# Patient Record
Sex: Male | Born: 1965 | Race: Black or African American | Hispanic: No | Marital: Married | Smoking: Current every day smoker
Health system: Southern US, Community
[De-identification: ages and names within clinical notes are randomized; demographics above are authoritative.]

## PROBLEM LIST (undated history)

## (undated) DIAGNOSIS — I10 Essential (primary) hypertension: Secondary | ICD-10-CM

## (undated) HISTORY — PX: WRIST SURGERY: SHX841

## (undated) HISTORY — DX: Essential (primary) hypertension: I10

---

## 2003-02-07 ENCOUNTER — Emergency Department (HOSPITAL_COMMUNITY): Admission: EM | Admit: 2003-02-07 | Discharge: 2003-02-08 | Payer: Self-pay | Admitting: Emergency Medicine

## 2010-10-26 ENCOUNTER — Emergency Department (INDEPENDENT_AMBULATORY_CARE_PROVIDER_SITE_OTHER): Payer: Worker's Compensation

## 2010-10-26 ENCOUNTER — Emergency Department (HOSPITAL_BASED_OUTPATIENT_CLINIC_OR_DEPARTMENT_OTHER)
Admission: EM | Admit: 2010-10-26 | Discharge: 2010-10-26 | Disposition: A | Discharge status: Other Acute Inpatient Hospital | Payer: Worker's Compensation | Source: Home / Self Care | Attending: Emergency Medicine | Admitting: Emergency Medicine

## 2010-10-26 ENCOUNTER — Ambulatory Visit (HOSPITAL_BASED_OUTPATIENT_CLINIC_OR_DEPARTMENT_OTHER)
Admission: RE | Admit: 2010-10-26 | Discharge: 2010-10-26 | Disposition: A | Discharge status: Home / Self Care | Payer: Worker's Compensation | Source: Ambulatory Visit | Attending: Orthopedic Surgery | Admitting: Orthopedic Surgery

## 2010-10-26 DIAGNOSIS — Y9289 Other specified places as the place of occurrence of the external cause: Secondary | ICD-10-CM | POA: Insufficient documentation

## 2010-10-26 DIAGNOSIS — Y93G3 Activity, cooking and baking: Secondary | ICD-10-CM | POA: Insufficient documentation

## 2010-10-26 DIAGNOSIS — Y990 Civilian activity done for income or pay: Secondary | ICD-10-CM | POA: Insufficient documentation

## 2010-10-26 DIAGNOSIS — W269XXA Contact with unspecified sharp object(s), initial encounter: Secondary | ICD-10-CM

## 2010-10-26 DIAGNOSIS — Z01812 Encounter for preprocedural laboratory examination: Secondary | ICD-10-CM | POA: Insufficient documentation

## 2010-10-26 DIAGNOSIS — W3189XA Contact with other specified machinery, initial encounter: Secondary | ICD-10-CM | POA: Insufficient documentation

## 2010-10-26 DIAGNOSIS — S61409A Unspecified open wound of unspecified hand, initial encounter: Secondary | ICD-10-CM | POA: Insufficient documentation

## 2010-10-26 DIAGNOSIS — S5420XA Injury of radial nerve at forearm level, unspecified arm, initial encounter: Secondary | ICD-10-CM | POA: Insufficient documentation

## 2010-10-26 DIAGNOSIS — R209 Unspecified disturbances of skin sensation: Secondary | ICD-10-CM | POA: Insufficient documentation

## 2010-10-26 DIAGNOSIS — W268XXA Contact with other sharp object(s), not elsewhere classified, initial encounter: Secondary | ICD-10-CM | POA: Insufficient documentation

## 2010-10-26 DIAGNOSIS — M7989 Other specified soft tissue disorders: Secondary | ICD-10-CM

## 2010-10-26 DIAGNOSIS — S66909A Unspecified injury of unspecified muscle, fascia and tendon at wrist and hand level, unspecified hand, initial encounter: Secondary | ICD-10-CM | POA: Insufficient documentation

## 2010-10-26 DIAGNOSIS — S41119A Laceration without foreign body of unspecified upper arm, initial encounter: Secondary | ICD-10-CM

## 2010-10-26 DIAGNOSIS — S61509A Unspecified open wound of unspecified wrist, initial encounter: Secondary | ICD-10-CM

## 2010-10-26 DIAGNOSIS — IMO0002 Reserved for concepts with insufficient information to code with codable children: Secondary | ICD-10-CM

## 2010-10-26 LAB — BASIC METABOLIC PANEL
CO2: 27 mEq/L (ref 19–32)
Calcium: 9.7 mg/dL (ref 8.4–10.5)
Chloride: 107 mEq/L (ref 96–112)
Creatinine, Ser: 0.9 mg/dL (ref 0.50–1.35)
Glucose, Bld: 98 mg/dL (ref 70–99)
Sodium: 144 mEq/L (ref 135–145)

## 2010-10-26 LAB — DIFFERENTIAL
Eosinophils Relative: 0 % (ref 0–5)
Lymphocytes Relative: 36 % (ref 12–46)
Lymphs Abs: 1.8 10*3/uL (ref 0.7–4.0)
Monocytes Absolute: 0.4 10*3/uL (ref 0.1–1.0)
Neutro Abs: 2.7 10*3/uL (ref 1.7–7.7)

## 2010-10-26 LAB — CBC
HCT: 40.7 % (ref 39.0–52.0)
MCV: 89.8 fL (ref 78.0–100.0)
RBC: 4.53 MIL/uL (ref 4.22–5.81)
WBC: 4.9 10*3/uL (ref 4.0–10.5)

## 2010-10-26 MED ORDER — FENTANYL CITRATE 0.05 MG/ML IJ SOLN
50.0000 ug | Freq: Once | INTRAMUSCULAR | Status: AC
  Administered 2010-10-26: 50 ug via INTRAVENOUS
  Administered 2010-10-26: 100 ug via INTRAVENOUS

## 2010-10-26 MED ORDER — CEFAZOLIN SODIUM 1-5 GM-% IV SOLN
1.0000 g | Freq: Once | INTRAVENOUS | Status: AC
  Administered 2010-10-26: 1 g via INTRAVENOUS

## 2010-10-26 MED ORDER — SODIUM CHLORIDE 0.9 % IV BOLUS (SEPSIS)
500.0000 mL | Freq: Once | INTRAVENOUS | Status: AC
  Administered 2010-10-26: 500 mL via INTRAVENOUS

## 2010-10-26 MED ORDER — FENTANYL CITRATE 0.05 MG/ML IJ SOLN
INTRAMUSCULAR | Status: AC
  Administered 2010-10-26: 100 ug via INTRAVENOUS
  Filled 2010-10-26: qty 2

## 2010-10-26 MED ORDER — CEFAZOLIN SODIUM 1-5 GM-% IV SOLN
INTRAVENOUS | Status: AC
  Administered 2010-10-26: 09:00:00
  Filled 2010-10-26: qty 50

## 2010-10-26 NOTE — ED Notes (Signed)
MD at bedside for evaluation

## 2010-10-26 NOTE — ED Notes (Signed)
Pt transported to radiology for XR and returned to ED room.

## 2010-10-26 NOTE — ED Notes (Signed)
Laceration to dorsal aspect of left hand.  Affected area is actively bleeding.  Pressure dressing reapplied to affected area.

## 2010-10-26 NOTE — ED Notes (Signed)
Pt is unable to close hand, sensation is present in all digits and decreased cap refill 3+ per MD. Pain 10/10 with movement of hand  Pt returned from radiology and dressing reapplied.

## 2010-10-26 NOTE — ED Notes (Signed)
Correction to previous medication entry-  Charted a total of 150 mcg of Fentanyl administered and actual 50 mcg administered. Actual dose of Ancef administered was 1 gram.  Unable to correct in MAR at time of pt d/c.  Report called to Marcelino Duster, RN at [facility] short stay surgery center.  Phone number for return call for questions given.  IV d/c'd prior to transfer.

## 2010-10-26 NOTE — ED Provider Notes (Signed)
History     CSN: [id number] Arrival date & time: 10/26/2010  8:26 AM  Chief Complaint  Patient presents with  . Extremity Laceration   The history is provided by the patient. No language interpreter was used.  Laceration is approximately 2 inches in length just proximal to the left wrist. Left hand is his non-dominant hand. Tetanus is within the last year. It occurred within the last hour while at work.  A metal scoop came down and lacerated the dorsum of the left arm.  He states the arm is numb. He cannot move the wrist of the fingers of the left hand.  No f/c/r. No head trauma. No LOC.  No associated injuries  Patient is not on anticoagulants.  Pain at site is a 10/ 10.  Has not taken anything for pain.  Worse when people attempt to move the extremity  History reviewed. No pertinent past medical history.  History reviewed. No pertinent past surgical history.  History reviewed. No pertinent family history.  History  Substance Use Topics  . Smoking status: Never Smoker   . Smokeless tobacco: Not on file  . Alcohol Use: No      Review of Systems  Constitutional: Negative for activity change.  HENT: Negative for facial swelling.   Eyes: Negative for discharge.  Respiratory: Negative for apnea.   Cardiovascular: Negative for chest pain.  Gastrointestinal: Negative for abdominal distention.  Genitourinary: Negative for difficulty urinating.  Musculoskeletal: Negative for joint swelling and arthralgias.  Neurological: Negative for dizziness.  Hematological: Negative.   Psychiatric/Behavioral: Negative.     Physical Exam  BP 162/113  Pulse 84  Temp(Src) 98.7 F (37.1 C) (Oral)  Resp 21  SpO2 99%  Physical Exam  Constitutional: He is oriented to person, place, and time. He appears well-developed and well-nourished. No distress.  HENT:  Head: Normocephalic and atraumatic.  Mouth/Throat: No oropharyngeal exudate.  Eyes: EOM are normal. Pupils are equal, round, and reactive  to light.  Neck: Normal range of motion. Neck supple.  Cardiovascular: Normal rate and regular rhythm.  Exam reveals no gallop and no friction rub.   No murmur heard. Pulmonary/Chest: Effort normal and breath sounds normal.  Abdominal: Soft. Bowel sounds are normal.  Musculoskeletal:       Unable to flex the fingers, unable to extend the left wrist.  Sensation intact on exam.   Visible tendon lacerations and vascular laceration within the wound. Cap refill in the left hand 2 seconds.  Hand warm.    Neurological: He is alert and oriented to person, place, and time.  Skin: Skin is warm and dry.  Psychiatric: He has a normal mood and affect.    ED Course  Procedures  1 g ancef hung, IV bolus and 50 mcg fentanyl IV for pain MDM Case d/w nurse of Dr. Merlyn Lot who will call back.  Wants a full wrist series.  Pull IV and go immediately to same day surgery.  Wife will take patient.        Jasmine Awe, MD 10/26/10 [phone]

## 2010-10-26 NOTE — ED Notes (Signed)
MD at bedside.Plans for Dr. Merlyn Lot to meet at [facility]. Preparing patient for transfer now via POV. Will pull IV before discharge.

## 2010-10-26 NOTE — ED Notes (Signed)
UDS collected by Mauricio Po, EMT.

## 2010-10-26 NOTE — ED Notes (Signed)
Reassessment of left extremity.  No change in assessment.  Sensation and movement present, color WNL and cap refill WNL.  Bandage CDI.

## 2010-11-09 NOTE — Op Note (Signed)
Willie Terry, Willie Terry NO.:  [id number]  MEDICAL RECORD NO.:  [id number]  LOCATION:  [facility]01                          FACILITY:  [facility]  PHYSICIAN:  Betha Loa, MD        DATE OF BIRTH:  Nov 04, 1965  DATE OF PROCEDURE:  10/26/2010 DATE OF DISCHARGE:  10/26/2010                              OPERATIVE REPORT   PREOPERATIVE DIAGNOSIS:  Left wrist dorsal laceration with tendon laceration to the extensor digitorum communis and superficial branch radial nerve laceration.  POSTOPERATIVE DIAGNOSIS:  Left wrist dorsal laceration with laceration of extensor digitorum communis to index, extensor digitorum communis to long, extensor indicis proprius, extensor carpi radialis longus, complete extensor carpi radialis brevis laceration, partial laceration superficial branch radial nerve.  PROCEDURE:  Left wrist irrigation debridement, repair of the extensor digitorum communis to long and index, extensor indicis proprius, extensor carpi radialis longus, extensor carpi radialis brevis, and superficial branch of radial nerve.  SURGEON:  Betha Loa, MD  ASSISTANTS:  None.  ANESTHESIA:  General.  IV FLUIDS:  Per anesthesia flow sheet.  ESTIMATED BLOOD LOSS:  Minimal.  COMPLICATIONS:  None.  SPECIMENS:  None.  TOURNIQUET TIME:  86 minutes.  DISPOSITION:  Stable to PACU.  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  He was seen at [facility] where he was evaluated.  Radiographs were taken revealing no fractures.  He was found to have a laceration.  I was consulted for management of the injury.  He was transferred to [facility] for my evaluation.  On evaluation, he had intact sensation and capillary refill in all fingertips.  He had weakness with extension of the wrist and difficulty extending his fingers.  He  also had some numbness on the dorsum of the hand.  There was a laceration at the dorsal aspect of the wrist more on the radial side.  I discussed with Willie Terry the nature of his injury.  We discussed operative treatment with irrigation, debridement, and repair of tendon lacerations.  Risks, benefits and alternatives of surgery were discussed including the risk of blood loss, infection, damage to nerves, vessels, tendons, ligaments, bone, failure of procedure, need for additional procedures, complications with wound healing, continued pain, weakness and lack of strength.  He voiced understanding of these risks and elected to proceed.  OPERATIVE COURSE:  After being identified preoperatively by myself, the patient and I agreed upon the procedure and site of procedure.  The surgery site was marked.  The risks, benefits and alternatives of surgery were reviewed and he wished to proceed.  He was given 2 g of IV Ancef for antibiotic coverage for his wound.  His tetanus had been updated within the last 2 years.  He was transported to the operating room and placed on the operating room table in supine position with the left upper extremity on arm board.  General anesthesia was induced by the anesthesiologist.  Left upper extremity was prepped and draped in normal sterile orthopedic fashion.  A surgical pause was performed  between surgeons, Anesthesia, operating room staff and all were in agreement as to the patient, procedure, and site of procedure. Tourniquet at the proximal aspect of the extremity was inflated to 250 mmHg after exsanguination of the limb with an Esmarch bandage.  The wound was extended both proximally and distally.  It was extended distally on the radial side and proximally the ulnar side.  Spreading technique was used in the deeper tissues.  The superficial branch of the radial nerve was identified and had a partial laceration.  The ECRL was identified and had a partial  laceration approximately 50%.  The ECRB had a 100% laceration. The extensor digitorum communis tendons were identified.  There was laceration to the EDC, to the long, and index as well as the EIP to the index.  The EIP and EDC to the index had retracted.  The EPL tendon was identified and was not lacerated.  It was pulled out through the wound and examined and no lacerations were found. The fourth dorsal compartment was opened to allow retrieval of the proximal tendon ends.  The wound was noted to go down into the joint. There was no gross contamination.  The wound was copiously irrigated with 1000 mL of sterile saline by bulb syringe.  Two 4-0 Vicryl sutures were placed in the joint capsule to help close down.  Repair of the extensor tendons were performed using 3-0 Ethibond suture.  A core suture repair was performed.  The EDC to the index and EIP were able to take a two-strand core suture repair.  The EDC to the long had a 2- suture core suture and was oversewn with a figure-of-eight.  The ECRB was repaired again using a two-strand core suture and oversewn with a two figure-of-eight sutures and the partial laceration to the ECRL was repaired using figure-of-eight sutures.  The superficial branch of radial nerve partial laceration was repaired using 9-0 nylon under loupe magnification.  This approximated the tendon edges and nerve edges well. The wound was again irrigated with sterile saline.  A 4-0 Vicryl was used in an inverted interrupted fashion subcutaneously and the skin was closed with 3-0 nylon in a horizontal mattress fashion.  The wound was injected with 10 mL of 0.25% plain Marcaine to aid in postoperative analgesia.  The wounds were dressed with sterile Xeroform and 4x4s and wrapped with a Kerlix bandage.  A volar splint was placed with the wrist extended 40 degrees in the MPs and approximately 20 degrees of flexion and the IPs extended.  This was wrapped with Kerlix and Ace  bandage. The tourniquet was deflated at 86 minutes.  The fingertips were pink with brisk capillary refill after deflation of the tourniquet.  The operative drapings were broken down and the patient was awoken from anesthesia safely.  He was transferred back to the stretcher and taken to the PACU in stable condition.  I will see him back in the office in 1 week for postoperative followup.  We will give him Percocet 5/325 one to two p.o. q. 6 h. p.r.n. pain dispensed #50.     Betha Loa, MD     KK/MEDQ  D:  10/26/2010  T:  10/27/2010  Job:  [id number]  Electronically Signed by Betha Loa  on 11/09/2010 10:15:16 AM

## 2010-11-09 NOTE — H&P (Addendum)
  Willie Terry, Willie Terry NO.:  [id number]  MEDICAL RECORD NO.:  [id number]  LOCATION:  [facility]01                          FACILITY:  [facility]  PHYSICIAN:  Betha Loa, MD        DATE OF BIRTH:  December 22, 1965  DATE OF ADMISSION:  10/26/2010 DATE OF DISCHARGE:  10/26/2010                             HISTORY & PHYSICAL   CHIEF COMPLAINT:  Left wrist laceration.  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  He was seen at [facility] where he was noted to have a laceration of the dorsum of the wrist with tendon involvement. Radiographs were taken revealing no fractures.  I was consulted for management of the injury.  He was [facility] to [facility] for my evaluation.  He reports no previous injuries to the wrist and no other injuries at this time.  He is in a splint.  ALLERGIES:  No known drug allergies.  PAST MEDICAL HISTORY:  None.  PAST SURGICAL HISTORY:  None.  MEDICATIONS:  None.  SOCIAL HISTORY:  Willie Terry works at [employer].  He smokes approximately 1 pack per week x15 years and drinks alcohol occasionally.  REVIEW OF SYSTEMS:  A 13-point review of systems is negative.  PHYSICAL EXAMINATION:  GENERAL:  Alert and oriented x3, well developed, well nourished, normal gait pattern. EXTREMITIES:  Bilateral upper extremities are intact to light touch, sensation, and capillary refill in all fingertips.  He can flex and extend the IP joint his thumbs and can lightly cross his fingers.  Right upper extremity is without wounds, without tenderness to palpation. Left upper extremity has a laceration at the dorsal radial aspect of the wrist.  He has decreased sensation on the dorsum of the hand distal to this.  He has intact EPL and FPL activity.  As weak extension of  the wrist and extension of the fingers.  The wound is approximately 5 cm in length.  RADIOGRAPHS:  AP and lateral views of the forearm and AP, lateral, oblique and scaphoid views of the wrist show no fractures, dislocations, or radiopaque foreign bodies.  ASSESSMENT/PLAN:  Left wrist laceration.  I discussed with Willie Terry the nature of his injury.  Recommended going to the operating room for irrigation and debridement of wound and repair of likely tendon lacerations and possible superficial branch radial nerve laceration. Risks, benefits, and alternatives of the surgery were discussed including risk of blood loss, infection, damage to nerves, vessels, tendons, ligaments, bone, failure of surgery, need for additional surgery, complications with wound healing, continued pain and weakness. He voiced his understanding of these risks and elected to proceed.  We will have the surgery arranged as soon as possible.     Betha Loa, MD     KK/MEDQ  D:  10/26/2010  T:  10/27/2010  Job:  [id number]  Electronically Signed by Betha Loa  on 11/12/2010 10:14:37 AM

## 2013-04-04 ENCOUNTER — Ambulatory Visit: Payer: 59

## 2013-04-04 ENCOUNTER — Other Ambulatory Visit: Payer: Self-pay | Admitting: Occupational Medicine

## 2013-04-04 DIAGNOSIS — Z0000 Encounter for general adult medical examination without abnormal findings: Secondary | ICD-10-CM

## 2013-04-16 ENCOUNTER — Ambulatory Visit (INDEPENDENT_AMBULATORY_CARE_PROVIDER_SITE_OTHER): Payer: 59 | Admitting: Family

## 2013-04-16 ENCOUNTER — Encounter: Payer: Self-pay | Admitting: Family

## 2013-04-16 VITALS — BP 160/112 | HR 62 | Temp 98.7°F | Resp 16

## 2013-04-16 DIAGNOSIS — Q619 Cystic kidney disease, unspecified: Secondary | ICD-10-CM

## 2013-04-16 DIAGNOSIS — F17200 Nicotine dependence, unspecified, uncomplicated: Secondary | ICD-10-CM

## 2013-04-16 DIAGNOSIS — R918 Other nonspecific abnormal finding of lung field: Secondary | ICD-10-CM

## 2013-04-16 DIAGNOSIS — Z720 Tobacco use: Secondary | ICD-10-CM

## 2013-04-16 DIAGNOSIS — R911 Solitary pulmonary nodule: Secondary | ICD-10-CM

## 2013-04-16 DIAGNOSIS — N281 Cyst of kidney, acquired: Secondary | ICD-10-CM

## 2013-04-16 DIAGNOSIS — I10 Essential (primary) hypertension: Secondary | ICD-10-CM | POA: Insufficient documentation

## 2013-04-16 DIAGNOSIS — R9389 Abnormal findings on diagnostic imaging of other specified body structures: Secondary | ICD-10-CM

## 2013-04-16 NOTE — Assessment & Plan Note (Signed)
Pt counseled on importance of quitting smoking.  Advised him to try nicorette gum in place of cigaretts.  3-5 minutes spent on tobacco cessation counseling.

## 2013-04-16 NOTE — Assessment & Plan Note (Signed)
Repeat manual BP 170/112 today in office.  Pt counseled on importance

## 2013-04-16 NOTE — Patient Instructions (Signed)
Please work on low sodium diet, weight loss and quitting smoking. Follow up in 1 month for complete physical. Come fasting to this appointment. Welcome to [facility]!

## 2013-04-16 NOTE — Progress Notes (Signed)
Subjective:    Patient ID: Willie Terry, male    DOB: 04/26/1965, 48 y.o.   MRN: [id number]  HPI  Willie Terry is a 48 yr old male who presents today to establish care. Reports that recently he had an abnormal chest x ray and was told that he may need CT for further evaluation.  X ray results reviewed in epic 04/04/13:  5 mm nodular opacity right lower lobe. Advise either noncontrast  chest CT at this time or followup chest radiograph in 3 months to  further assess. Lungs elsewhere are clear.   + tobacco abuse- x 21 yrs 2 cigarettes. Smokes at work.  He quit for 6-8 months.    Elevated blood pressure- has been told elevated blood pressure at age 48.  Review of records within our system note BP of 116/113 in 2012 during ED visit.  Denies palpitations.  257lb, 6 feet tall   Review of Systems  Constitutional: Negative for unexpected weight change.  HENT: Negative for rhinorrhea.   Respiratory: Negative for cough and shortness of breath.   Cardiovascular: Negative for chest pain and leg swelling.  Gastrointestinal: Negative for nausea, vomiting and diarrhea.  Genitourinary: Negative for dysuria and frequency.  Musculoskeletal: Negative for arthralgias and myalgias.  Skin: Negative for rash.  Neurological: Negative for headaches.  Hematological: Negative for adenopathy.  Psychiatric/Behavioral:       Denies depression/anxiety   Past Medical History  Diagnosis Date  . Hypertension     History   Social History  . Marital Status: Married    Spouse Name: N/A    Number of Children: N/A  . Years of Education: N/A   Occupational History  . Not on file.   Social History Main Topics  . Smoking status: Current Every Day Smoker -- 21 years    Types: Cigarettes  . Smokeless tobacco: Never Used     Comment: 1-2 cigarettes daily  . Alcohol Use: Yes     Comment: occasional  . Drug Use: No  . Sexual Activity: Not on file   Other Topics Concern  . Not on file   Social History  Narrative   Married   Expecting son this week   Works as a Advertising account plannerproduction operator [employer]   Enjoys gardening, football   Completed associates degree    [place] in [place]    Past Surgical History  Procedure Laterality Date  . Wrist surgery Left 2011 / 2012    laceration, nerve repair per pt    Family History  Problem Relation Age of Onset  . Thyroid disease Mother   . Aneurysm Mother     No Known Allergies  No current outpatient prescriptions on file prior to visit.   No current facility-administered medications on file prior to visit.    BP 160/112  Pulse 62  Temp(Src) 98.7 F (37.1 C) (Oral)  Resp 16  SpO2 98%       Objective:   Physical Exam  Constitutional: He appears well-developed and well-nourished. No distress.  HENT:  Head: Normocephalic and atraumatic.  Right Ear: Tympanic membrane and ear canal normal.  Left Ear: Tympanic membrane and ear canal normal.  Mouth/Throat: No oropharyngeal exudate, posterior oropharyngeal edema or posterior oropharyngeal erythema.  Cardiovascular: Normal rate and regular rhythm.   No murmur heard. Pulmonary/Chest: Effort normal and breath sounds normal. No respiratory distress. He has no wheezes. He has no rales. He exhibits no tenderness.  Psychiatric: He has a normal mood and affect. His  behavior is normal. Thought content normal.          Assessment & Plan:

## 2013-04-16 NOTE — Progress Notes (Signed)
Pre visit review using our clinic review tool, if applicable. No additional management support is needed unless otherwise documented below in the visit note. 

## 2013-04-17 ENCOUNTER — Encounter: Payer: Self-pay | Admitting: Family

## 2013-04-17 ENCOUNTER — Ambulatory Visit (HOSPITAL_BASED_OUTPATIENT_CLINIC_OR_DEPARTMENT_OTHER)
Admission: RE | Admit: 2013-04-17 | Discharge: 2013-04-17 | Disposition: A | Discharge status: Home / Self Care | Payer: 59 | Source: Ambulatory Visit | Attending: Family | Admitting: Family

## 2013-04-17 ENCOUNTER — Telehealth: Payer: Self-pay | Admitting: Family

## 2013-04-17 DIAGNOSIS — M479 Spondylosis, unspecified: Secondary | ICD-10-CM | POA: Insufficient documentation

## 2013-04-17 DIAGNOSIS — Z87891 Personal history of nicotine dependence: Secondary | ICD-10-CM | POA: Insufficient documentation

## 2013-04-17 DIAGNOSIS — R911 Solitary pulmonary nodule: Secondary | ICD-10-CM | POA: Insufficient documentation

## 2013-04-17 DIAGNOSIS — N289 Disorder of kidney and ureter, unspecified: Secondary | ICD-10-CM | POA: Insufficient documentation

## 2013-04-17 LAB — URINALYSIS, ROUTINE W REFLEX MICROSCOPIC
Bilirubin Urine: NEGATIVE
GLUCOSE, UA: NEGATIVE mg/dL
Hgb urine dipstick: NEGATIVE
Ketones, ur: NEGATIVE mg/dL
LEUKOCYTES UA: NEGATIVE
Nitrite: NEGATIVE
PROTEIN: NEGATIVE mg/dL
Specific Gravity, Urine: 1.025 (ref 1.005–1.030)
UROBILINOGEN UA: 0.2 mg/dL (ref 0.0–1.0)
pH: 6.5 (ref 5.0–8.0)

## 2013-04-17 LAB — BASIC METABOLIC PANEL
BUN: 14 mg/dL (ref 6–23)
CO2: 30 mEq/L (ref 19–32)
CREATININE: 1 mg/dL (ref 0.50–1.35)
Calcium: 9.5 mg/dL (ref 8.4–10.5)
Chloride: 104 mEq/L (ref 96–112)
Glucose, Bld: 94 mg/dL (ref 70–99)
POTASSIUM: 4.1 meq/L (ref 3.5–5.3)
Sodium: 139 mEq/L (ref 135–145)

## 2013-04-17 NOTE — Telephone Encounter (Signed)
Relevant patient education assigned to patient using Emmi. ° °

## 2013-04-18 ENCOUNTER — Telehealth: Payer: Self-pay | Admitting: Family

## 2013-04-18 DIAGNOSIS — N281 Cyst of kidney, acquired: Secondary | ICD-10-CM | POA: Insufficient documentation

## 2013-04-18 DIAGNOSIS — R9389 Abnormal findings on diagnostic imaging of other specified body structures: Secondary | ICD-10-CM | POA: Insufficient documentation

## 2013-04-18 NOTE — Assessment & Plan Note (Signed)
Ct chest incidentally noted renal cyst:  Hyperdense exophytic 1.6 cm left renal mass likely a hyperdense cyst. Recommend followup CT pre and postcontrast 6 months.   Plan CT in 6 months.

## 2013-04-18 NOTE — Assessment & Plan Note (Signed)
CT chest does not show nodule noted on CXR.

## 2013-04-18 NOTE — Telephone Encounter (Signed)
Please call pt and let him know CT chest is negative for nodule noted on CXR.  They did not a cyst on his kidney and it is recommended that he have a renal CT in 6 months to follow up on cyst.

## 2013-04-18 NOTE — Telephone Encounter (Signed)
LMOM with contact name and number for return call RE: results and further provider instructions/SLS  

## 2013-04-19 NOTE — Telephone Encounter (Signed)
Notified pt and he voices understanding. 

## 2013-04-23 ENCOUNTER — Telehealth: Payer: Self-pay | Admitting: Family

## 2013-04-23 NOTE — Telephone Encounter (Signed)
Relevant patient education assigned to patient using Emmi. ° °

## 2013-04-29 ENCOUNTER — Telehealth: Payer: Self-pay | Admitting: Family

## 2013-04-29 NOTE — Telephone Encounter (Signed)
Opened in error

## 2013-04-30 ENCOUNTER — Encounter: Payer: Self-pay | Admitting: Family

## 2013-05-21 ENCOUNTER — Encounter: Payer: 59 | Admitting: Family

## 2013-05-21 DIAGNOSIS — Z0289 Encounter for other administrative examinations: Secondary | ICD-10-CM

## 2014-01-17 ENCOUNTER — Telehealth: Payer: Self-pay | Admitting: Family

## 2014-01-17 DIAGNOSIS — N281 Cyst of kidney, acquired: Secondary | ICD-10-CM

## 2014-01-17 NOTE — Telephone Encounter (Signed)
Yes, he is due for follow up CT to re-evaluate renal cyst and make sure that it is stable. thanks

## 2014-01-17 NOTE — Telephone Encounter (Signed)
Melissa-- this blank note was routed to me. Is there something we need to tell pt?

## 2014-01-17 NOTE — Telephone Encounter (Signed)
Left message to return my call.  

## 2014-01-21 NOTE — Telephone Encounter (Signed)
Left message for pt to return my call.

## 2014-01-22 NOTE — Telephone Encounter (Signed)
Unable to reach pt by phone, mailed letter

## 2018-05-25 ENCOUNTER — Ambulatory Visit (INDEPENDENT_AMBULATORY_CARE_PROVIDER_SITE_OTHER): Payer: Self-pay

## 2018-05-25 ENCOUNTER — Other Ambulatory Visit: Payer: Self-pay | Admitting: Gerontology

## 2018-05-25 DIAGNOSIS — R52 Pain, unspecified: Secondary | ICD-10-CM

## 2018-05-25 DIAGNOSIS — M25552 Pain in left hip: Secondary | ICD-10-CM

## 2019-09-30 IMAGING — DX DG HIP (WITH OR WITHOUT PELVIS) 2-3V*L*
3 series · 3 of 3 positions shown · non-contrast
Comparison: None.

CLINICAL DATA: Slipped on a wet floor work 2 days ago. Left lateral
hip pain.

EXAM:
DG HIP (WITH OR WITHOUT PELVIS) 2-3V LEFT

[pelvis ap]
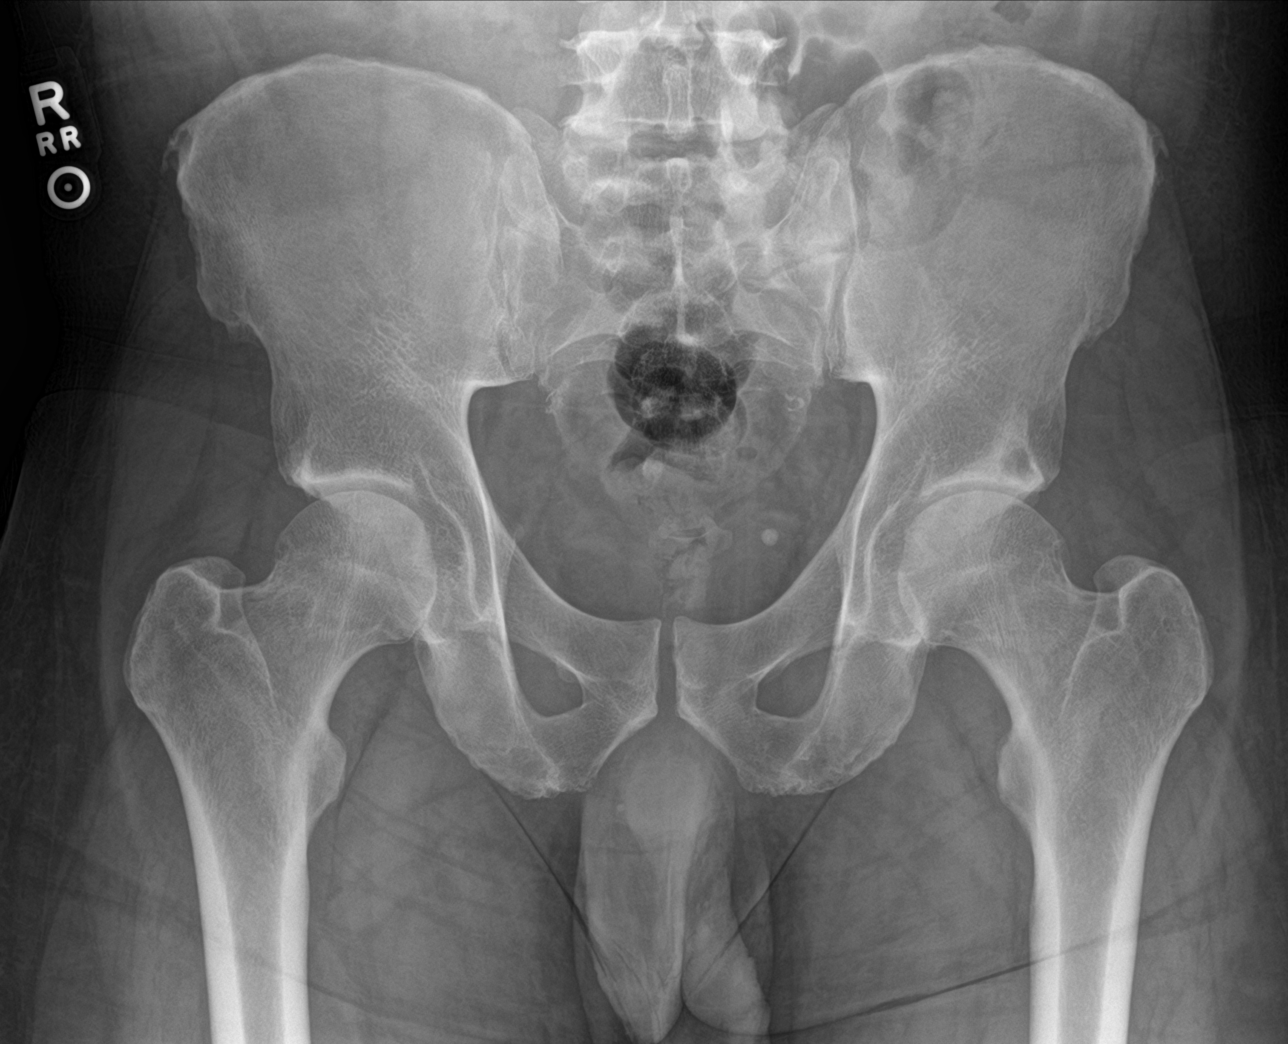

[hip ap]
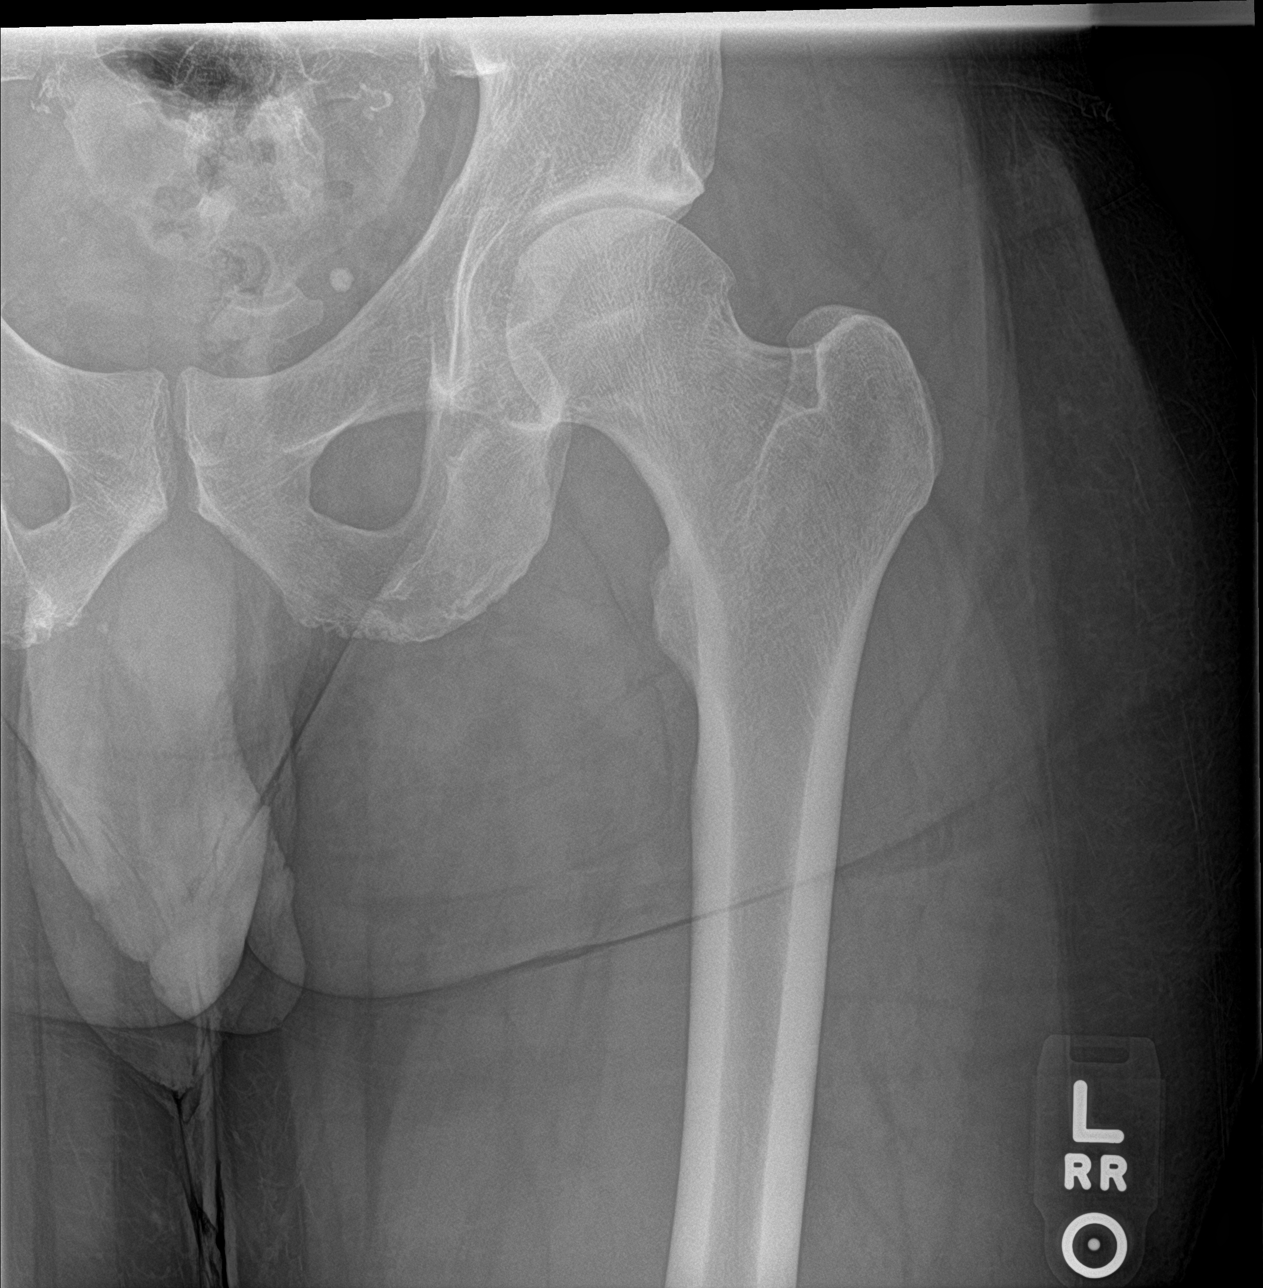

[hip lat]
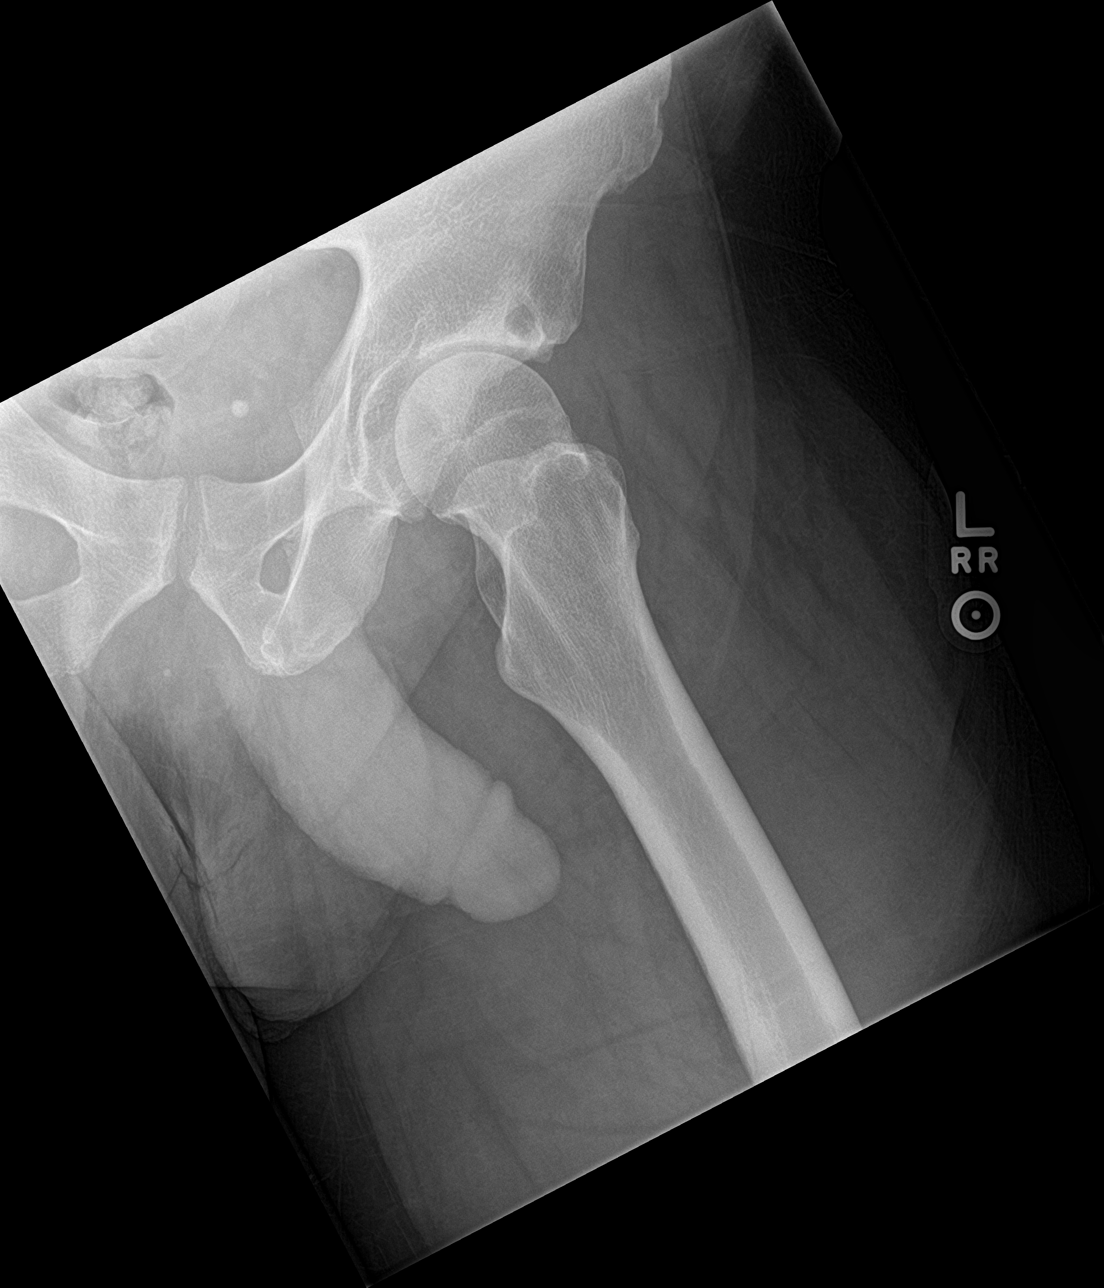

[3 of 3 positions shown; findings below may reference images not displayed]

FINDINGS: No fracture or bone lesion.

Mild superolateral hip joint space narrowing, superior acetabular
subchondral sclerosis and cystic change and small marginal
osteophytes from the base of the femoral head. Right hip joint
normally spaced and aligned as are the SI joints and symphysis
pubis.

Soft tissues are unremarkable.
IMPRESSION: 1. No fracture or acute finding.
2. Mild left joint degenerative changes.
